# Patient Record
Sex: Male | Born: 1935 | Race: White | Hispanic: No | Smoking: Never smoker
Health system: Southern US, Community
[De-identification: ages and names within clinical notes are randomized; demographics above are authoritative.]

## PROBLEM LIST (undated history)

## (undated) DIAGNOSIS — C61 Malignant neoplasm of prostate: Secondary | ICD-10-CM

## (undated) DIAGNOSIS — I10 Essential (primary) hypertension: Secondary | ICD-10-CM

## (undated) DIAGNOSIS — I2510 Atherosclerotic heart disease of native coronary artery without angina pectoris: Secondary | ICD-10-CM

## (undated) HISTORY — DX: Essential (primary) hypertension: I10

## (undated) HISTORY — DX: Malignant neoplasm of prostate: C61

## (undated) HISTORY — DX: Atherosclerotic heart disease of native coronary artery without angina pectoris: I25.10

## (undated) HISTORY — PX: CORONARY ANGIOPLASTY WITH STENT PLACEMENT: SHX49

---

## 2020-07-13 ENCOUNTER — Other Ambulatory Visit: Payer: Self-pay | Admitting: Urology

## 2020-07-13 DIAGNOSIS — N2889 Other specified disorders of kidney and ureter: Secondary | ICD-10-CM

## 2020-07-15 ENCOUNTER — Ambulatory Visit
Admission: RE | Admit: 2020-07-15 | Discharge: 2020-07-15 | Disposition: A | Discharge status: Home / Self Care | Payer: Medicare Other | Source: Ambulatory Visit | Attending: Urology | Admitting: Urology

## 2020-07-15 ENCOUNTER — Other Ambulatory Visit: Payer: Self-pay

## 2020-07-15 ENCOUNTER — Encounter: Payer: Self-pay | Admitting: *Deleted

## 2020-07-15 DIAGNOSIS — N2889 Other specified disorders of kidney and ureter: Secondary | ICD-10-CM

## 2020-07-15 HISTORY — PX: IR RADIOLOGIST EVAL & MGMT: IMG5224

## 2020-07-15 NOTE — Consult Note (Signed)
Chief Complaint: Patient was consulted remotely today (TeleHealth) for  at the request of Stoneking,Bradley J..    Referring Physician(s): [place].  History of Present Illness: Fred Becker is a 86 y.o. male who presents at the kind request of Dr. Felipa Eth to assess candidacy for percutaneous cryoablation of an incidentally discovered right renal mass.  Fred Becker has a history or prostate cancer.  Due to elevating PSA he underwent PET CT which showed no evidence of metastatic disease but identified a right renal mass.  Subsequent MRI imaging shows a large avidly enhancing solid mass in the anterior interpolar right kidney.  He remains asymptomatic and states that he feels 'great'.  He denies hematuria, flank pain, weight loss or other systemic symptoms.   Allergies: Patient has no allergy information on record.  Medications: Prior to Admission medications   Not on File     No family history on file.  Social History   Socioeconomic History  . Marital status: Not on file    Spouse name: Not on file  . Number of children: Not on file  . Years of education: Not on file  . Highest education level: Not on file  Occupational History  . Not on file  Tobacco Use  . Smoking status: Not on file  . Smokeless tobacco: Not on file  Substance and Sexual Activity  . Alcohol use: Not on file  . Drug use: Not on file  . Sexual activity: Not on file  Other Topics Concern  . Not on file  Social History Narrative  . Not on file   Social Determinants of Health   Financial Resource Strain: Not on file  Food Insecurity: Not on file  Transportation Needs: Not on file  Physical Activity: Not on file  Stress: Not on file  Social Connections: Not on file    ECOG Status: 0 - Asymptomatic  Review of Systems  Review of Systems: A 12 point ROS discussed and pertinent positives are indicated in the HPI above.  All other systems are negative.  Physical Exam No direct  physical exam was performed (except for noted visual exam findings with Video Visits).   Vital Signs: There were no vitals taken for this visit.  Imaging: IR Radiologist Eval & Mgmt  Result Date: 07/15/2020 Please refer to notes tab for details about interventional procedure. (Op Note)   Labs:  CBC: No results for input(s): WBC, HGB, HCT, PLT in the last 8760 hours.  COAGS: No results for input(s): INR, APTT in the last 8760 hours.  BMP: No results for input(s): NA, K, CL, CO2, GLUCOSE, BUN, CALCIUM, CREATININE, GFRNONAA, GFRAA in the last 8760 hours.  Invalid input(s): CMP  LIVER FUNCTION TESTS: No results for input(s): BILITOT, AST, ALT, ALKPHOS, PROT, ALBUMIN in the last 8760 hours.  TUMOR MARKERS: No results for input(s): AFPTM, CEA, CA199, CHROMGRNA in the last 8760 hours.  Assessment and Plan:  Extremely pleasant 86 year-old male with a large enhancing right-sided renal mass.  Based on the size (5.4 cm in craniocaudal dimension) and position (anterior, interpolar adjacent to renal artery, vein and collecting system), percutaneous cryoablation would be relatively higher risk than normal with a lower probability of achieving local control.  Given Fred Becker relatively good health, he may be a candidate for robotic assisted minimally invasive nephrectomy.   I discussed the natural history of enhancing renal masses at length.  He understands that there is an 80% chance that this is a malignant RCC  and only a 20% chance that this is a benign neoplasm.  He further understands that while some RCCs are indolent, or slow growing, others can grow more aggressively and metastasize.     I will refer him back to Dr. Felipa Eth for possible referral to [facility] for consideration fo robot assisted surgery.    Thank you for this interesting consult.  I greatly enjoyed meeting Fred Becker and look forward to participating in their care.  A copy of this report was sent to the requesting  provider on this date.  Electronically Signed: Criselda Peaches 07/15/2020, 4:00 PM  I spent a total of  30 Minutes  in remote  clinical consultation, greater than 50% of which was counseling/coordinating care for right renal mass.    Visit type: Audio only (telephone). Audio (no video) only due to patient preference. Alternative for in-person consultation at [facility], [address], [place], [place]. This visit type was conducted due to national recommendations for restrictions regarding the COVID-19 Pandemic (e.g. social distancing).  This format is felt to be most appropriate for this patient at this time.  All issues noted in this document were discussed and addressed.

## 2021-04-28 ENCOUNTER — Encounter: Payer: Self-pay | Admitting: Urology

## 2021-04-28 ENCOUNTER — Ambulatory Visit: Payer: Medicare Other | Admitting: Urology

## 2021-04-28 ENCOUNTER — Other Ambulatory Visit: Payer: Self-pay

## 2021-04-28 VITALS — BP 144/65 | HR 56

## 2021-04-28 DIAGNOSIS — N2889 Other specified disorders of kidney and ureter: Secondary | ICD-10-CM

## 2021-04-28 DIAGNOSIS — Z8546 Personal history of malignant neoplasm of prostate: Secondary | ICD-10-CM | POA: Diagnosis not present

## 2021-04-28 DIAGNOSIS — R9721 Rising PSA following treatment for malignant neoplasm of prostate: Secondary | ICD-10-CM | POA: Diagnosis not present

## 2021-04-28 LAB — URINALYSIS, ROUTINE W REFLEX MICROSCOPIC
Bilirubin, UA: NEGATIVE
Glucose, UA: NEGATIVE
Ketones, UA: NEGATIVE
Leukocytes,UA: NEGATIVE
Nitrite, UA: NEGATIVE
RBC, UA: NEGATIVE
Specific Gravity, UA: 1.015 (ref 1.005–1.030)
Urobilinogen, Ur: 0.2 mg/dL (ref 0.2–1.0)
pH, UA: 7 (ref 5.0–7.5)

## 2021-04-28 NOTE — Progress Notes (Signed)
Urological Symptom Review  Patient is experiencing the following symptoms: Get up at night to urinate Erection problems  Review of Systems  Gastrointestinal (upper)  : Negative for upper GI symptoms  Gastrointestinal (lower) : Negative for lower GI symptoms  Constitutional : Negative for symptoms  Skin: Negative for skin symptoms  Eyes: Negative for eye symptoms  Ear/Nose/Throat : Negative for Ear/Nose/Throat symptoms  Hematologic/Lymphatic: Negative for Hematologic/Lymphatic symptoms  Cardiovascular : Negative for cardiovascular symptoms  Respiratory : Negative for respiratory symptoms  Endocrine: Negative for endocrine symptoms  Musculoskeletal: Negative for musculoskeletal symptoms  Neurological: Negative for neurological symptoms  Psychologic: Negative for psychiatric symptoms

## 2021-04-28 NOTE — Progress Notes (Signed)
Assessment: 1. Personal history of prostate cancer   2. Rising PSA following treatment for malignant neoplasm of prostate   3. Renal mass   4. Other specified disorders of kidney and ureter      Plan: I reviewed the patient's records from [facility] as well as his imaging studies and office notes from interventional radiology.  I again discussed the potential significance of a rising PSA and concern for recurrent prostate cancer. PSA today I discussed possible further evaluation with a PSMA PET scan. Schedule for MRI of the kidney with and without contrast for evaluation of the right renal mass. I will contact him with results and recommendations for follow-up.  Chief Complaint:  Chief Complaint  Patient presents with   Prostate Cancer     History of Present Illness:  Fred Becker is a 86 y.o. year old male who is seen for continued evaluation of rising PSA following treatment of prostate cancer and right renal mass. He was diagnosed with prostate cancer in May 2005.  His PSA was 6.8 at the time of diagnosis.  Biopsy showed Gleason 6, T1c adenocarcinoma.  Prostate volume measured 40 g.  He underwent treatment with external beam radiation in 2005.  No PSA results immediately following treatment available. PSA results: 9/15 3.52 10/16 2.09 6/21 4.84 9/21 5.14 12/21 5.33 3/22 5.3 4/22 5.1  He has not had any weight loss or bone pain.  He was evaluated with a AXUMIN PET/CT in December 2021.  This showed heterogeneous activity within the prostate which was felt to represent possible carcinoma.  No evidence of metastatic adenopathy, visceral disease or skeletal metastasis.  He was found to have a 4 cm right renal mass.  The right renal mass found on PET scan was evaluated further with a MRI on 06/02/2020.  The MRI showed a 4.5 x 3.6 cm enhancing mass associated with the upper and mid pole of the right kidney consistent with renal cell carcinoma.  There is no evidence of renal  vein involvement or adenopathy.  Treatment options were discussed with the patient.  He was seen by interventional radiology for possible percutaneous ablation.  He was not felt to be a good candidate for ablation due to the size and location of the mass.  The patient was not interested in proceeding with surgical management.  He has not had any flank pain or gross hematuria.  No recent imaging. CMP from 04/02/2021 showed a creatinine of 1.55 and normal liver function tests.  He is not having any significant lower urinary tract symptoms.  He does have nocturia x2. IPSS = 4 today.  Past Medical History:  Past Medical History:  Diagnosis Date   Coronary artery disease    Hypertension    Prostate cancer [facility])     Past Surgical History:  Past Surgical History:  Procedure Laterality Date   CORONARY ANGIOPLASTY WITH STENT PLACEMENT     IR RADIOLOGIST EVAL & MGMT  07/15/2020    Allergies:  Allergies  Allergen Reactions   Mibefradil Other (See Comments)    Had severe life threatening reaction Bradycardia    Family History:  History reviewed. No pertinent family history.  Social History:  Social History   Tobacco Use   Smoking status: Never   Smokeless tobacco: Never  Substance Use Topics   Alcohol use: Never    Review of symptoms:  Constitutional:  Negative for unexplained weight loss, night sweats, fever, chills ENT:  Negative for nose bleeds, sinus pain, painful  swallowing CV:  Negative for chest pain, shortness of breath, exercise intolerance, palpitations, loss of consciousness Resp:  Negative for cough, wheezing, shortness of breath GI:  Negative for nausea, vomiting, diarrhea, bloody stools GU:  Positives noted in HPI; otherwise negative for gross hematuria, dysuria, urinary incontinence Neuro:  Negative for seizures, poor balance, limb weakness, slurred speech Psych:  Negative for lack of energy, depression, anxiety Endocrine:  Negative for polydipsia, polyuria,  symptoms of hypoglycemia (dizziness, hunger, sweating) Hematologic:  Negative for anemia, purpura, petechia, prolonged or excessive bleeding, use of anticoagulants  Allergic:  Negative for difficulty breathing or choking as a result of exposure to anything; no shellfish allergy; no allergic response (rash/itch) to materials, foods  Physical exam: BP (!) 144/65   Pulse (!) 56  GENERAL APPEARANCE:  Well appearing, well developed, well nourished, NAD HEENT: Atraumatic, Normocephalic, oropharynx clear. NECK: Supple without lymphadenopathy or thyromegaly. LUNGS: Clear to auscultation bilaterally. HEART: Regular Rate and Rhythm without murmurs, gallops, or rubs. ABDOMEN: Soft, non-tender, No Masses. EXTREMITIES: Moves all extremities well.  Without clubbing, cyanosis, or edema. NEUROLOGIC:  Alert and oriented x 3, normal gait, CN II-XII grossly intact.  MENTAL STATUS:  Appropriate. BACK:  Non-tender to palpation.  No CVAT SKIN:  Warm, dry and intact.    Results: Results for orders placed or performed in visit on 04/28/21 (from the past 24 hour(s))  Urinalysis, Routine w reflex microscopic   Collection Time: 04/28/21  3:26 PM  Result Value Ref Range   Specific Gravity, UA 1.015 1.005 - 1.030   pH, UA 7.0 5.0 - 7.5   Color, UA Yellow Yellow   Appearance Ur Clear Clear   Leukocytes,UA Negative Negative   Protein,UA Trace (A) Negative/Trace   Glucose, UA Negative Negative   Ketones, UA Negative Negative   RBC, UA Negative Negative   Bilirubin, UA Negative Negative   Urobilinogen, Ur 0.2 0.2 - 1.0 mg/dL   Nitrite, UA Negative Negative   Microscopic Examination Comment

## 2021-04-29 LAB — PSA: Prostate Specific Ag, Serum: 7.1 ng/mL — ABNORMAL HIGH (ref 0.0–4.0)

## 2021-05-04 NOTE — Addendum Note (Signed)
Addended by: Primus Bravo on: 05/04/2021 03:30 PM   Modules accepted: Orders

## 2021-05-20 ENCOUNTER — Encounter (HOSPITAL_COMMUNITY): Payer: Medicare Other

## 2021-06-04 ENCOUNTER — Other Ambulatory Visit: Payer: Self-pay

## 2021-06-04 ENCOUNTER — Ambulatory Visit (HOSPITAL_BASED_OUTPATIENT_CLINIC_OR_DEPARTMENT_OTHER)
Admission: RE | Admit: 2021-06-04 | Discharge: 2021-06-04 | Disposition: A | Discharge status: Home / Self Care | Payer: Medicare Other | Source: Ambulatory Visit | Attending: Urology | Admitting: Urology

## 2021-06-04 DIAGNOSIS — N2889 Other specified disorders of kidney and ureter: Secondary | ICD-10-CM | POA: Diagnosis not present

## 2021-06-04 MED ORDER — GADOBUTROL 1 MMOL/ML IV SOLN
7.0000 mL | Freq: Once | INTRAVENOUS | Status: AC | PRN
  Administered 2021-06-04: 7 mL via INTRAVENOUS

## 2021-06-08 ENCOUNTER — Encounter (HOSPITAL_COMMUNITY)
Admission: RE | Admit: 2021-06-08 | Discharge: 2021-06-08 | Disposition: A | Discharge status: Home / Self Care | Payer: Medicare Other | Source: Ambulatory Visit | Attending: Urology | Admitting: Urology

## 2021-06-08 ENCOUNTER — Other Ambulatory Visit: Payer: Self-pay

## 2021-06-08 DIAGNOSIS — R9721 Rising PSA following treatment for malignant neoplasm of prostate: Secondary | ICD-10-CM | POA: Insufficient documentation

## 2021-06-08 MED ORDER — PIFLIFOLASTAT F 18 (PYLARIFY) INJECTION
9.0000 | Freq: Once | INTRAVENOUS | Status: AC
  Administered 2021-06-08: 9.6 via INTRAVENOUS

## 2021-06-15 ENCOUNTER — Telehealth: Payer: Self-pay

## 2021-06-15 NOTE — Telephone Encounter (Signed)
Opened in error

## 2021-08-04 ENCOUNTER — Ambulatory Visit: Payer: Medicare Other | Admitting: Urology

## 2021-10-10 ENCOUNTER — Telehealth: Payer: Self-pay

## 2021-10-10 NOTE — Telephone Encounter (Signed)
Patient called advising that he got his PSA . Results are in chart for your viewing. Also, patient request a telephone visit if possible for next months visit due to some health issues in his family. Please advise on appointment request. ?

## 2021-10-13 NOTE — Telephone Encounter (Signed)
Patient aware you may not call until end of your clinic day but may call at 2:30 if you are able.

## 2021-11-07 ENCOUNTER — Ambulatory Visit: Payer: Medicare Other | Admitting: Urology

## 2021-11-17 ENCOUNTER — Ambulatory Visit (INDEPENDENT_AMBULATORY_CARE_PROVIDER_SITE_OTHER): Payer: Medicare Other | Admitting: Urology

## 2021-11-17 ENCOUNTER — Encounter: Payer: Self-pay | Admitting: Urology

## 2021-11-17 ENCOUNTER — Ambulatory Visit: Payer: Medicare Other | Admitting: Urology

## 2021-11-17 DIAGNOSIS — Z8546 Personal history of malignant neoplasm of prostate: Secondary | ICD-10-CM

## 2021-11-17 DIAGNOSIS — R9721 Rising PSA following treatment for malignant neoplasm of prostate: Secondary | ICD-10-CM

## 2021-11-17 DIAGNOSIS — N2889 Other specified disorders of kidney and ureter: Secondary | ICD-10-CM | POA: Diagnosis not present

## 2021-11-17 NOTE — Progress Notes (Addendum)
Assessment: 1. Rising PSA following treatment for malignant neoplasm of prostate; PSMA PET with recurrence in prostate, no metastatic disease   2. Personal history of prostate cancer   3. Renal mass, right; consistent with renal cell carcinoma     Plan: I connected with  [place] on 11/17/21 by phone and verified that I am speaking with the correct person using two identifiers. I discussed the limitations of evaluation and management by telemedicine. The patient expressed understanding and agreed to proceed.  I reviewed the imaging results from January 2023 as well as his more recent laboratory results. His PSA remains stable. I discussed management options for recurrent prostate cancer including continued observation versus androgen deprivation therapy.  He would like to observe for now. I again discussed options for management of the right renal mass suspicious for renal cell carcinoma.  He is not a candidate for percutaneous ablation.  Surgical management would have increased risk given his advanced age. We will arrange for reevaluation of the right renal mass with a MRI abdomen with and without contrast next month. Chest x-ray at time of MRI We will contact him with results and make arrangements for follow-up.  I provided 15 minutes of non-face-to-face time during this encounter.  Chief Complaint:  Chief Complaint  Patient presents with   Prostate Cancer   renal mass   History of Present Illness:  Dasean Brow is a 87 y.o. year old male who is seen for continued evaluation of rising PSA following treatment of prostate cancer and right renal mass. He was diagnosed with prostate cancer in May 2005.  His PSA was 6.8 at the time of diagnosis.  Biopsy showed Gleason 6, T1c adenocarcinoma.  Prostate volume measured 40 g.  He underwent treatment with external beam radiation in 2005.  No PSA results immediately following treatment available. PSA  results: 9/15 3.52 10/16 2.09 6/21 4.84 9/21 5.14 12/21 5.33 3/22 5.3 4/22 5.1 12/22 7.1 5/23 6.1  He has not had any weight loss or bone pain.  He was evaluated with a AXUMIN PET/CT in December 2021.  This showed heterogeneous activity within the prostate which was felt to represent possible carcinoma.  No evidence of metastatic adenopathy, visceral disease or skeletal metastasis.  He was found to have a 4 cm right renal mass.  He underwent evaluation with a PSMA PET scan in January 2023.  This study showed intense activity within the prostate consistent with recurrent prostate carcinoma, no evidence of metastatic adenopathy, visceral metastasis or skeletal metastasis.  The right renal mass found on PET scan was evaluated further with a MRI on 06/02/2020.  The MRI showed a 4.5 x 3.6 cm enhancing mass associated with the upper and mid pole of the right kidney consistent with renal cell carcinoma.  There is no evidence of renal vein involvement or adenopathy.  Treatment options were discussed with the patient.  He was seen by interventional radiology for possible percutaneous ablation.  He was not felt to be a good candidate for ablation due to the size and location of the mass.  The patient was not interested in proceeding with surgical management.  He has not had any flank pain or gross hematuria.   CMP from 04/02/2021 showed a creatinine of 1.55 and normal liver function tests. CMP from 09/27/2021 showed a of 1.42 and normal liver function test. MRI abdomen and pelvis from 06/05/2021 showed a 4.7 x 4.3 cm heterogeneously enhancing right interpolar renal mass consistent with renal cell carcinoma,  no evidence of metastatic disease.  He is not having any bone pain or weight loss.  No flank pain or gross hematuria.  Portions of the above documentation were copied from a prior visit for review purposes only.   Past Medical History:  Past Medical History:  Diagnosis Date   Coronary artery disease     Hypertension    Prostate cancer [facility])     Past Surgical History:  Past Surgical History:  Procedure Laterality Date   CORONARY ANGIOPLASTY WITH STENT PLACEMENT     IR RADIOLOGIST EVAL & MGMT  07/15/2020    Allergies:  Allergies  Allergen Reactions   Mibefradil Other (See Comments)    Had severe life threatening reaction Bradycardia    Family History:  No family history on file.  Social History:  Social History   Tobacco Use   Smoking status: Never   Smokeless tobacco: Never  Substance Use Topics   Alcohol use: Never    ROS: Constitutional:  Negative for fever, chills, weight loss CV: Negative for chest pain, previous MI, hypertension Respiratory:  Negative for shortness of breath, wheezing, sleep apnea, frequent cough GI:  Negative for nausea, vomiting, bloody stool, GERD  Physical exam: No exam performed  Results: None

## 2021-11-26 ENCOUNTER — Ambulatory Visit (HOSPITAL_BASED_OUTPATIENT_CLINIC_OR_DEPARTMENT_OTHER)
Admission: RE | Admit: 2021-11-26 | Discharge: 2021-11-26 | Disposition: A | Discharge status: Home / Self Care | Payer: Medicare Other | Source: Ambulatory Visit | Attending: Urology | Admitting: Urology

## 2021-11-26 DIAGNOSIS — N2889 Other specified disorders of kidney and ureter: Secondary | ICD-10-CM

## 2021-11-26 MED ORDER — GADOBUTROL 1 MMOL/ML IV SOLN
7.5000 mL | Freq: Once | INTRAVENOUS | Status: AC | PRN
  Administered 2021-11-26: 7.5 mL via INTRAVENOUS

## 2021-12-06 ENCOUNTER — Encounter: Payer: Self-pay | Admitting: Urology

## 2021-12-06 ENCOUNTER — Telehealth: Payer: Self-pay

## 2021-12-06 NOTE — Telephone Encounter (Signed)
Patient called in regards to his MRI results.  He received your message and wants you to know that he wants to continue observation at this point rather than moving forward with surgery.

## 2022-04-11 ENCOUNTER — Ambulatory Visit: Payer: Medicare Other | Admitting: Urology

## 2022-05-01 ENCOUNTER — Other Ambulatory Visit: Payer: Self-pay | Admitting: Urology

## 2022-05-01 DIAGNOSIS — N2889 Other specified disorders of kidney and ureter: Secondary | ICD-10-CM

## 2022-05-02 ENCOUNTER — Other Ambulatory Visit: Payer: Self-pay | Admitting: Urology

## 2022-05-02 DIAGNOSIS — N2889 Other specified disorders of kidney and ureter: Secondary | ICD-10-CM

## 2022-05-16 ENCOUNTER — Telehealth: Payer: Medicare Other

## 2022-07-19 ENCOUNTER — Ambulatory Visit
Admission: RE | Admit: 2022-07-19 | Discharge: 2022-07-19 | Disposition: A | Discharge status: Home / Self Care | Payer: Medicare Other | Source: Ambulatory Visit | Attending: Urology | Admitting: Urology

## 2022-07-19 DIAGNOSIS — N2889 Other specified disorders of kidney and ureter: Secondary | ICD-10-CM

## 2022-07-19 NOTE — Progress Notes (Signed)
Chief Complaint: Patient was consulted remotely today (TeleHealth) for a right renal mass at the request of Coughlin,Paul W. F..    Referring Physician(s): Coughlin,Paul W. F.  History of Present Illness: Fred Becker is a 88 y.o. male previously seen by my partner, Dr. Jacqulynn Cadet on 07/15/2000 after referral from Dr. Felipa Eth, for a 5 cm right anterior renal mass. At that time, it was felt that Fred Becker was not a candidate for percutaneous ablation given the size of the lesion as well as it's location near central vessels and the central collecting system and renal pelvis. Surgical resection was discussed with him but he elected for surveillance given his age and some underlying CKD, and a follow up MRI was performed on 11/26/2021. More recently, a follow up CT was performed on 05/31/2022 and he has transferred urologic care to Dr. Thomasene Mohair. He is being sent back to discuss the mass to determine if there are any other possible percutaneous treatment options.  Past Medical History:  Diagnosis Date   Coronary artery disease    Hypertension    Prostate cancer [facility])     Past Surgical History:  Procedure Laterality Date   CORONARY ANGIOPLASTY WITH STENT PLACEMENT     IR RADIOLOGIST EVAL & MGMT  07/15/2020    Allergies: Mibefradil  Medications: Prior to Admission medications   Medication Sig Start Date End Date Taking? Authorizing Provider  allopurinol (ZYLOPRIM) 100 MG tablet Take by mouth.    [provider]  amLODipine (NORVASC) 10 MG tablet Take 1 tablet by mouth daily. 09/23/20 09/23/21  [provider]  aspirin 81 MG EC tablet Take by mouth.    [provider]  atenolol (TENORMIN) 50 MG tablet Take 50 mg by mouth daily. 01/10/21   [provider]  atorvastatin (LIPITOR) 10 MG tablet Take 10 mg by mouth daily. 04/17/21   [provider]  losartan-hydrochlorothiazide (HYZAAR) 100-12.5 MG tablet Take 1 tablet by mouth daily.  04/17/21   [provider]     No family history on file.  Social History   Socioeconomic History   Marital status: Married    Spouse name: Not on file   Number of children: Not on file   Years of education: Not on file   Highest education level: Not on file  Occupational History   Occupation: Retired  Tobacco Use   Smoking status: Never   Smokeless tobacco: Never  Substance and Sexual Activity   Alcohol use: Never   Drug use: Not on file   Sexual activity: Not on file  Other Topics Concern   Not on file  Social History Narrative   Not on file   Social Determinants of Health   Financial Resource Strain: Not on file  Food Insecurity: Not on file  Transportation Needs: Not on file  Physical Activity: Not on file  Stress: Not on file  Social Connections: Not on file    ECOG Status: 0 - Asymptomatic  Review of Systems  Constitutional: Negative.   Respiratory: Negative.    Cardiovascular: Negative.   Gastrointestinal: Negative.   Genitourinary: Negative.   Musculoskeletal: Negative.   Neurological: Negative.     Review of Systems: A 12 point ROS discussed and pertinent positives are indicated in the HPI above.  All other systems are negative.   Physical Exam No direct physical exam was performed (except for noted visual exam findings with Video Visits).   Vital Signs: There were no vitals taken for this  visit.  Imaging: No results found.   Assessment and Plan:  I spoke with Fred Becker by phone. By my personal measurements, the solid and enhancing right anterior, interpolar renal mass measurements by imaging are:  06/04/21: 4.8 x 3.9 x 4.8 cm 11/26/21: 4.9 x 4.1 x 4.8 cm 05/31/22: 5.1 x 4.1 x 5.0 cm  The lesion shows extremely slow growth over the last year. It remains too large and in a poor location for treatment by percutaneous ablation. Fortunately, he remains asymptomatic with no pain or hematuria. Fred Becker remains against pursuing surgical  resection at his age. I did offer him image guided biopsy of the mass if that would give him more peace of mind, but he did not want to pursue biopsy at this time. The only palliative non-invasive procedure that might be of assistance is catheter embolization of arterial blood supply should he experience significant, refractory hematuria in the future due to the mass. He will follow up with Dr. Thomasene Mohair.   Electronically Signed: Azzie Roup 07/19/2022, 3:24 PM    I spent a total of 10 Minutes in remote  clinical consultation, greater than 50% of which was counseling/coordinating care for a right renal mass.    Visit type: Audio only (telephone). Audio (no video) only due to patient's lack of internet/smartphone capability. Alternative for in-person consultation at [facility], [address], [place], [place]. This visit type was conducted due to national recommendations for restrictions regarding the COVID-19 Pandemic (e.g. social distancing).  This format is felt to be most appropriate for this patient at this time.  All issues noted in this document were discussed and addressed.

## 2023-05-16 IMAGING — CT NM PET TUM IMG SKULL BASE T - THIGH
1 of 7 series · 1 of 25 positions shown · non-contrast
Comparison: None.

CLINICAL DATA: 86-year-old male with rising PSA following radiation
therapy for prostate carcinoma. PSA equal

EXAM:
NUCLEAR MEDICINE PET SKULL BASE TO THIGH
TECHNIQUE: 9.6 mCi F18 Piflufolastat (Pylarify) was injected intravenously.
Full-ring PET imaging was performed from the skull base to thigh
after the radiotracer. CT data was obtained and used for attenuation
correction and anatomic localization.

[Series 3: pet sk_thigh ac · axial · 5.0mm · 4.07mm/px · 1 of 250 slices shown]
[im 150/250]
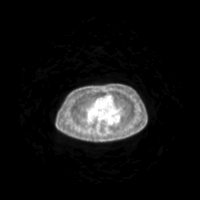

[1 of 25 positions shown; findings below may reference images not displayed]

FINDINGS: NECK

No radiotracer activity in neck lymph nodes.

Incidental CT finding: None

CHEST

No radiotracer accumulation within mediastinal or hilar lymph nodes.
No suspicious pulmonary nodules on the CT scan.

Incidental CT finding: Simple fluid attenuation rounded lesion in
the anterior mediastinum measuring 2.6 cm. No radiotracer activity.

ABDOMEN/PELVIS

Prostate: Focus intense radiotracer activity in the RIGHT lobe of
the prostate gland measures approximately 1 cm and has intense
radiotracer activity SUV max equal 9.7. Small focus of activity in
the LEFT lobe of the gland less defined with SUV max equal 3.6.

Lymph nodes: No abnormal radiotracer accumulation within pelvic or
abdominal nodes.

Liver: No evidence of liver metastasis

Incidental CT finding: Partially exophytic lesion from the RIGHT
kidney measures 4.6 x 4.2 cm (image 139/CT series 4). This lesion is
mixed density with portions greater than simple fluid attenuation.

Additional cysts within LEFT RIGHT kidney of simple fluid
attenuation including large LEFT renal cysts measuring 8.5 cm.

Atherosclerotic calcification of the aorta.

SKELETON

No focal  activity to suggest skeletal metastasis.
IMPRESSION: 1. Focus intense radiotracer activity within the prostate gland is
concerning for residual/recurrent prostate carcinoma.
2. No evidence metastatic adenopathy in the pelvis or periaortic
retroperitoneum.
3. No evidence of visceral metastasis or skeletal metastasis.
4. Mixed density lesion exophytic from the RIGHT kidney. Lesion
described as "consistent with renal cell carcinoma" on MRI
06/04/2021.
[DATE]. Benign appearing cystic lesion in the anterior mediastinum.

## 2024-06-08 ENCOUNTER — Other Ambulatory Visit: Payer: Self-pay

## 2024-06-08 ENCOUNTER — Emergency Department (HOSPITAL_BASED_OUTPATIENT_CLINIC_OR_DEPARTMENT_OTHER)

## 2024-06-08 ENCOUNTER — Emergency Department (HOSPITAL_BASED_OUTPATIENT_CLINIC_OR_DEPARTMENT_OTHER)
Admission: EM | Admit: 2024-06-08 | Discharge: 2024-06-08 | Disposition: A | Discharge status: Home / Self Care | Attending: Emergency Medicine | Admitting: Emergency Medicine

## 2024-06-08 ENCOUNTER — Encounter (HOSPITAL_BASED_OUTPATIENT_CLINIC_OR_DEPARTMENT_OTHER): Payer: Self-pay

## 2024-06-08 DIAGNOSIS — T83098A Other mechanical complication of other indwelling urethral catheter, initial encounter: Secondary | ICD-10-CM | POA: Insufficient documentation

## 2024-06-08 DIAGNOSIS — Z7982 Long term (current) use of aspirin: Secondary | ICD-10-CM | POA: Insufficient documentation

## 2024-06-08 DIAGNOSIS — Y732 Prosthetic and other implants, materials and accessory gastroenterology and urology devices associated with adverse incidents: Secondary | ICD-10-CM | POA: Diagnosis not present

## 2024-06-08 DIAGNOSIS — R310 Gross hematuria: Secondary | ICD-10-CM | POA: Diagnosis not present

## 2024-06-08 DIAGNOSIS — T839XXA Unspecified complication of genitourinary prosthetic device, implant and graft, initial encounter: Secondary | ICD-10-CM

## 2024-06-08 LAB — BASIC METABOLIC PANEL WITH GFR
Anion gap: 11 (ref 5–15)
BUN: 43 mg/dL — ABNORMAL HIGH (ref 8–23)
CO2: 26 mmol/L (ref 22–32)
Calcium: 9.2 mg/dL (ref 8.9–10.3)
Chloride: 105 mmol/L (ref 98–111)
Creatinine, Ser: 2.1 mg/dL — ABNORMAL HIGH (ref 0.61–1.24)
GFR, Estimated: 30 mL/min — ABNORMAL LOW
Glucose, Bld: 121 mg/dL — ABNORMAL HIGH (ref 70–99)
Potassium: 4.6 mmol/L (ref 3.5–5.1)
Sodium: 142 mmol/L (ref 135–145)

## 2024-06-08 LAB — URINALYSIS, W/ REFLEX TO CULTURE (INFECTION SUSPECTED)
RBC / HPF: >50 RBC/hpf (ref 0–5)
WBC, UA: >50 WBC/hpf (ref 0–5)

## 2024-06-08 LAB — CBC WITH DIFFERENTIAL/PLATELET
Abs Immature Granulocytes: 0.03 K/uL (ref 0.00–0.07)
Basophils Absolute: 0 K/uL (ref 0.0–0.1)
Basophils Relative: 1 %
Eosinophils Absolute: 0.1 K/uL (ref 0.0–0.5)
Eosinophils Relative: 1 %
HCT: 28.8 % — ABNORMAL LOW (ref 39.0–52.0)
Hemoglobin: 9.8 g/dL — ABNORMAL LOW (ref 13.0–17.0)
Immature Granulocytes: 0 %
Lymphocytes Relative: 10 %
Lymphs Abs: 0.8 K/uL (ref 0.7–4.0)
MCH: 31.9 pg (ref 26.0–34.0)
MCHC: 34 g/dL (ref 30.0–36.0)
MCV: 93.8 fL (ref 80.0–100.0)
Monocytes Absolute: 0.7 K/uL (ref 0.1–1.0)
Monocytes Relative: 8 %
Neutro Abs: 6.6 K/uL (ref 1.7–7.7)
Neutrophils Relative %: 80 %
Platelets: 183 K/uL (ref 150–400)
RBC: 3.07 MIL/uL — ABNORMAL LOW (ref 4.22–5.81)
RDW: 13.1 % (ref 11.5–15.5)
WBC: 8.2 K/uL (ref 4.0–10.5)
nRBC: 0 % (ref 0.0–0.2)

## 2024-06-08 MED ORDER — MORPHINE SULFATE (PF) 4 MG/ML IV SOLN
4.0000 mg | Freq: Once | INTRAVENOUS | Status: AC
  Administered 2024-06-08: 4 mg via INTRAVENOUS
  Filled 2024-06-08: qty 1

## 2024-06-08 MED ORDER — LIDOCAINE HCL URETHRAL/MUCOSAL 2 % EX GEL
1.0000 | Freq: Once | CUTANEOUS | Status: AC
  Administered 2024-06-08: 1 via URETHRAL
  Filled 2024-06-08: qty 11

## 2024-06-08 MED ORDER — SODIUM CHLORIDE 0.9 % IV BOLUS
1000.0000 mL | Freq: Once | INTRAVENOUS | Status: AC
  Administered 2024-06-08: 1000 mL via INTRAVENOUS

## 2024-06-08 MED ORDER — SODIUM CHLORIDE 0.9 % IV SOLN
1.0000 g | Freq: Once | INTRAVENOUS | Status: AC
  Administered 2024-06-08: 1 g via INTRAVENOUS
  Filled 2024-06-08: qty 10

## 2024-06-08 NOTE — ED Provider Notes (Signed)
 " [place] EMERGENCY DEPARTMENT AT [facility] Provider Note   CSN: [id number] Arrival date & time: 06/08/24  8069     Patient presents with: Urinary Retention   Fred Becker is a 89 y.o. male history of renal mass, recurrent urinary retention here presenting with catheter issue.  Patient had urinary retention about a month ago.  Patient had Foley catheter placed with hematuria.  Patient then had Foley removed by urology around 2 weeks later.  Patient states that he developed urinary retention yesterday.  Went to [facility] and had Foley catheter placed.  He then went back 2 more times today because the catheter keep him getting clotted.  Patient states that he noticed some blood around the catheter.  Patient also noticed blood inside the catheter as well.   The history is provided by the patient.       Prior to Admission medications  Medication Sig Start Date End Date Taking? Authorizing Provider  allopurinol (ZYLOPRIM) 100 MG tablet Take by mouth.    [provider]  amLODipine (NORVASC) 10 MG tablet Take 1 tablet by mouth daily. 09/23/20 09/23/21  [provider]  aspirin 81 MG EC tablet Take by mouth.    [provider]  atenolol (TENORMIN) 50 MG tablet Take 50 mg by mouth daily. 01/10/21   [provider]  atorvastatin (LIPITOR) 10 MG tablet Take 10 mg by mouth daily. 04/17/21   [provider]  losartan-hydrochlorothiazide (HYZAAR) 100-12.5 MG tablet Take 1 tablet by mouth daily. 04/17/21   [provider]    Allergies: Mibefradil    Review of Systems  Genitourinary:  Positive for hematuria.  All other systems reviewed and are negative.   Updated Vital Signs BP (!) 165/64 (BP Location: Right Arm)   Pulse 73   Temp 97.6 F (36.4 C)   Resp 16   SpO2 99%   Physical Exam Vitals and nursing note reviewed.  Constitutional:      Comments: Chronically ill   HENT:     Head: Normocephalic.     Nose:  Nose normal.     Mouth/Throat:     Mouth: Mucous membranes are moist.  Eyes:     Extraocular Movements: Extraocular movements intact.     Pupils: Pupils are equal, round, and reactive to light.  Cardiovascular:     Rate and Rhythm: Normal rate and regular rhythm.     Pulses: Normal pulses.     Heart sounds: Normal heart sounds.  Pulmonary:     Effort: Pulmonary effort is normal.     Breath sounds: Normal breath sounds.  Abdominal:     General: Abdomen is flat.  Genitourinary:    Comments: Foley catheter with hematuria  Musculoskeletal:        General: Normal range of motion.     Cervical back: Normal range of motion and neck supple.  Skin:    General: Skin is warm.     Capillary Refill: Capillary refill takes less than 2 seconds.  Neurological:     General: No focal deficit present.     Mental Status: He is alert.  Psychiatric:        Mood and Affect: Mood normal.        Behavior: Behavior normal.     (all labs ordered are listed, but only abnormal results are displayed) Labs Reviewed  CBC WITH DIFFERENTIAL/PLATELET  BASIC METABOLIC PANEL WITH GFR    EKG: None  Radiology: No results found.  Procedures   Medications Ordered in the ED  lidocaine  (XYLOCAINE ) 2 % jelly 1 Application (has no administration in time range)  sodium chloride  0.9 % bolus 1,000 mL (has no administration in time range)                                    Medical Decision Making Julis Haubner is a 89 y.o. male here presenting with hematuria.  Patient has a 92 French and then has gross hematuria and also drainage around the tube.  Per the notes earlier today, they had to flush the tube and there was a clot inside the tube.  I am concerned about the amount of hematuria.  I will replace it with a larger French three-way catheter and irrigate the catheter.  Will also recheck CBC and BMP and give IV fluids  10:02 PM I reviewed patient's labs and creatinine is baseline at 2.1.  UA showed blood  and rare bacteria.  CT scan showed renal mass which is known and grossly unchanged.  Nursing was able to place a 20 French three-way catheter.  I was able to irrigated and got some clots out.  Subsequently the hematuria has improved.  Patient follows up with urology at [facility].  He was prescribed cefdinir already.  Told him to take as prescribed and follow-up with his urologist.  Problems Addressed: Gross hematuria: acute illness or injury Urinary catheter complication, initial encounter: acute illness or injury  Amount and/or Complexity of Data Reviewed Labs: ordered. Decision-making details documented in ED Course. Radiology: ordered and independent interpretation performed. Decision-making details documented in ED Course.  Risk Prescription drug management.     Final diagnoses:  None    ED Discharge Orders     None          Patt Alm Macho, MD 06/08/24 2203  "

## 2024-06-08 NOTE — Discharge Instructions (Signed)
 As we discussed, you have some blood clots in your bladder and I was able to irrigate your catheter.  Please stay hydrated  Please fill your cefdinir as prescribed by your doctor  You need to call your urologist tomorrow for appointment for follow-up in a week to assess your catheter  Return to ER if you have severe pain or catheter not draining or large clots in your catheter

## 2024-06-08 NOTE — ED Triage Notes (Signed)
 Returns with c/o  urine not draining from catheter tube.  Denies difficulty/pain with urination, only co is urine in the tube.  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

## 2024-06-08 NOTE — ED Triage Notes (Addendum)
 Pt. Arrives from home with chief complaint of leaking around indwelling cath site (placed yesterday) and leg bag tubing not allowing drainage (clot)   Leg bag will be replaced in triage

## 2024-06-08 NOTE — ED Triage Notes (Signed)
 Pt presents via POV c/o urinary catheter not draining.

## 2024-06-08 NOTE — ED Provider Notes (Signed)
 [facility] Emergency Department Emergency Department Provider Note  This document was created using the aid of voice recognition Dragon dictation software.   Provider at bedside: 06/08/2024 9:19 AM  History obtained from the: Patient  History   Chief Complaint  Patient presents with   Urinary Problem     History provided by:  Patient Language interpreter used: No     Fred Becker is a 89 y.o. 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  Patient was very seen in the ED 12/11 with urinary retention.  He had a Foley catheter placed for approximately 1 week which was later removed.  He returned to the ED last night with recurrent retention and again had a Foley catheter placed.  Upon waking up this morning, patient had concern that he did not have any drainage into the bag (though there was urine in the tubing).  He presented to the ED for further evaluation though at time my evaluation, he has had drainage into the bag.  He otherwise has no acute complaints - denies any abdominal pain, fever, chills and no other reported symptoms.  Per patient, he does have grossly bloody urine at baseline - he does follow with a urologist who he last saw 2 days ago and is scheduled to see again next month.  ______________________ ROS: Pertinent positives and negatives per HPI.  Physical Exam   Vitals:   06/08/24 0829  BP: 119/67  BP Location: Right arm  Patient Position: Sitting  Pulse: 104  Resp: 17  Temp: 97.4 F (36.3 C)  SpO2: 98%  Weight: 73.5 kg (162 lb)  Height: 175.3 cm (5' 9)    Physical Exam Constitutional:      Appearance: Normal appearance. He is not toxic-appearing.  HENT:     Head: Normocephalic and atraumatic.  Cardiovascular:     Rate and Rhythm: Normal rate.  Pulmonary:     Effort: Pulmonary effort is normal.  Abdominal:     Palpations: Abdomen  is soft.     Tenderness: There is no abdominal tenderness.     Comments: Foley catheter in place with dark red urine in the catheter tubing and the leg bag (one small clot in the bag)  Skin:    General: Skin is warm and dry.  Neurological:     Mental Status: He is alert.  Psychiatric:        Mood and Affect: Mood normal.     Results    ED Course     Medical Decision Making    DDX: urinary retention, foley catheter problem, catheter blockage   Clinical Complexity   Patient's presentation is most consistent with acute presentation with potential threat to life or bodily function.  Patient's age increases the complexity of managing their  presentation with foley catheter problem.    Provider time spent in patient care today, inclusive of but not limited to clinical reassessment, review of diagnostic studies, and discharge preparation, was greater than 30 minutes.   All Imaging and lab work personally viewed and interpreted by myself. My interpretations are as follow:  Patient presenting to the ED with concern for Foley catheter problem.  He is well-appearing with reassuring vital signs and has no other complaints.  He does appear to have some slow drainage between the tubing and bag of his Foley catheter, possibly partially obstructed by a small clot as  he has a small clot in the bag as well.  The bag was exchanged and he had good drainage into the bag.  He is stable for discharge to follow-up with urology as an outpatient who he plans to call tomorrow.  Discussed strict ED return precautions  Medical Decision Making Problems Addressed: Problem with Foley catheter, initial encounter: acute illness or injury    ED Clinical Impression   1. Problem with Foley catheter, initial encounter    FOLLOW UP  Call urology to schedule closest follow up    [facility] -  EMERGENCY DEPARTMENT [address] [place]  [place] [address] [phone]  If symptoms worsen   ED Disposition     ED Disposition  Discharge   Condition  Stable   Comment  --        _____________________________

## 2024-06-09 LAB — URINE CULTURE: Culture: NO GROWTH

## 2024-06-10 ENCOUNTER — Emergency Department (HOSPITAL_BASED_OUTPATIENT_CLINIC_OR_DEPARTMENT_OTHER)
Admission: EM | Admit: 2024-06-10 | Discharge: 2024-06-10 | Disposition: A | Discharge status: Home / Self Care | Attending: Emergency Medicine | Admitting: Emergency Medicine

## 2024-06-10 DIAGNOSIS — Z7982 Long term (current) use of aspirin: Secondary | ICD-10-CM | POA: Diagnosis not present

## 2024-06-10 DIAGNOSIS — Y732 Prosthetic and other implants, materials and accessory gastroenterology and urology devices associated with adverse incidents: Secondary | ICD-10-CM | POA: Insufficient documentation

## 2024-06-10 DIAGNOSIS — T83011A Breakdown (mechanical) of indwelling urethral catheter, initial encounter: Secondary | ICD-10-CM

## 2024-06-10 DIAGNOSIS — T83038A Leakage of other indwelling urethral catheter, initial encounter: Secondary | ICD-10-CM | POA: Insufficient documentation

## 2024-06-10 NOTE — ED Triage Notes (Signed)
 Patient reports foley catheter is leaking, states that it is happening on his upper thigh. Patient reports he woke up and his pants were wet.   NAD noted, VSS, ambulates with steady gait.

## 2024-06-10 NOTE — ED Provider Notes (Signed)
 " [place] EMERGENCY DEPARTMENT AT [facility] Provider Note   CSN: [id number] Arrival date & time: 06/10/24  0013     Patient presents with: Foley Catheter leaking   Fred Becker is a 89 y.o. male.   The history is provided by the patient and medical records.   Fred Becker is a 89 y.o. male who presents to the Emergency Department complaining of catheter leaking.  He presents to the emergency department for evaluation of leaking from his Foley catheter.  He has had a Foley catheter in place intermittently since December.  He states that just started leaking today around his thigh area. No abdominal pain, nausea, vomiting, shortness of breath.  He does not take any blood thinners.  Prior to Admission medications  Medication Sig Start Date End Date Taking? Authorizing Provider  allopurinol (ZYLOPRIM) 100 MG tablet Take by mouth.    [provider]  amLODipine (NORVASC) 10 MG tablet Take 1 tablet by mouth daily. 09/23/20 09/23/21  [provider]  aspirin 81 MG EC tablet Take by mouth.    [provider]  atenolol (TENORMIN) 50 MG tablet Take 50 mg by mouth daily. 01/10/21   [provider]  atorvastatin (LIPITOR) 10 MG tablet Take 10 mg by mouth daily. 04/17/21   [provider]  losartan-hydrochlorothiazide (HYZAAR) 100-12.5 MG tablet Take 1 tablet by mouth daily. 04/17/21   [provider]    Allergies: Mibefradil    Review of Systems  All other systems reviewed and are negative.   Updated Vital Signs BP (!) 162/58 (BP Location: Right Arm)   Pulse 66   Temp 97.9 F (36.6 C) (Oral)   Resp 18   Ht 5' 8 (1.727 m)   Wt 73 kg   SpO2 97%   BMI 24.47 kg/m   Physical Exam Vitals and nursing note reviewed.  Constitutional:      Appearance: He is well-developed.  HENT:     Head: Normocephalic and atraumatic.  Cardiovascular:     Rate and Rhythm: Normal rate and regular rhythm.  Pulmonary:     Effort:  Pulmonary effort is normal. No respiratory distress.  Abdominal:     Palpations: Abdomen is soft.     Tenderness: There is no abdominal tenderness. There is no guarding or rebound.  Genitourinary:    Comments: 22 French Foley three-way catheter in place.  There is a small amount of blood-tinged urine in his leg bag. Musculoskeletal:        General: No tenderness.  Skin:    General: Skin is warm and dry.  Neurological:     Mental Status: He is alert and oriented to person, place, and time.  Psychiatric:        Behavior: Behavior normal.     (all labs ordered are listed, but only abnormal results are displayed) Labs Reviewed - No data to display  EKG: None      Procedures   Medications Ordered in the ED - No data to display                                  Medical Decision Making  Patient here for evaluation of leakage from his catheter.  He does have a three-way catheter in place and the clamp over the additional port had open.  Nursing was able to clamp this port and his leakage stopped.  The catheter is draining.  He does have blood-tinged urine, appears to be voiding well.  Discussed importance of urology follow-up.  Feel patient is stable for discharge with outpatient follow-up and return precautions.     Final diagnoses:  Malfunction of Foley catheter, initial encounter    ED Discharge Orders     None          Griselda Norris, MD 06/10/24 [phone]  "

## 2024-06-10 NOTE — ED Notes (Signed)
 Leg bag replaced, clamp applied to irrigation line that was leaking down patients leg. Patient cleaned and new securement device applied. No signs on leaking now.
